# Patient Record
Sex: Male | Born: 1960
Health system: Southern US, Community
[De-identification: ages and names within clinical notes are randomized; demographics above are authoritative.]

## PROBLEM LIST (undated history)

## (undated) DIAGNOSIS — I10 Essential (primary) hypertension: Secondary | ICD-10-CM

## (undated) DIAGNOSIS — G4730 Sleep apnea, unspecified: Secondary | ICD-10-CM

## (undated) HISTORY — PX: OTHER SURGICAL HISTORY: SHX169

## (undated) HISTORY — PX: KNEE SURGERY: SHX244

## (undated) HISTORY — PX: ELBOW SURGERY: SHX618

## (undated) HISTORY — PX: MOHS SURGERY: SHX181

---

## 1999-03-18 DIAGNOSIS — C801 Malignant (primary) neoplasm, unspecified: Secondary | ICD-10-CM

## 1999-03-18 HISTORY — DX: Malignant (primary) neoplasm, unspecified: C80.1

## 2001-05-03 ENCOUNTER — Ambulatory Visit (HOSPITAL_BASED_OUTPATIENT_CLINIC_OR_DEPARTMENT_OTHER): Admission: RE | Admit: 2001-05-03 | Discharge: 2001-05-03 | Payer: Self-pay | Admitting: Urology

## 2001-05-03 ENCOUNTER — Encounter (INDEPENDENT_AMBULATORY_CARE_PROVIDER_SITE_OTHER): Payer: Self-pay | Admitting: Specialist

## 2001-05-18 ENCOUNTER — Encounter: Payer: Self-pay | Admitting: Urology

## 2001-05-18 ENCOUNTER — Encounter: Admission: RE | Admit: 2001-05-18 | Discharge: 2001-05-18 | Payer: Self-pay | Admitting: Urology

## 2005-05-27 ENCOUNTER — Emergency Department (HOSPITAL_COMMUNITY): Admission: EM | Admit: 2005-05-27 | Discharge: 2005-05-27 | Payer: Self-pay | Admitting: Family Medicine

## 2005-11-02 ENCOUNTER — Emergency Department (HOSPITAL_COMMUNITY): Admission: EM | Admit: 2005-11-02 | Discharge: 2005-11-02 | Payer: Self-pay | Admitting: Emergency Medicine

## 2006-02-16 ENCOUNTER — Ambulatory Visit (HOSPITAL_BASED_OUTPATIENT_CLINIC_OR_DEPARTMENT_OTHER): Admission: RE | Admit: 2006-02-16 | Discharge: 2006-02-16 | Payer: Self-pay | Admitting: Orthopedic Surgery

## 2007-11-18 ENCOUNTER — Ambulatory Visit: Payer: Self-pay | Admitting: Vascular Surgery

## 2007-11-18 ENCOUNTER — Encounter (INDEPENDENT_AMBULATORY_CARE_PROVIDER_SITE_OTHER): Payer: Self-pay | Admitting: Family Medicine

## 2007-11-18 ENCOUNTER — Ambulatory Visit: Admission: RE | Admit: 2007-11-18 | Discharge: 2007-11-18 | Payer: Self-pay | Admitting: Family Medicine

## 2008-02-01 IMAGING — CT CT CERVICAL SPINE W/O CM
3 of 4 series · 15 of 33 positions shown, 18 images · non-contrast
Comparison: None.
COMPARISON: None.

CLINICAL DATA: Fell from ladder, striking back of head. Questionable loss of
consciousness. Frontal laceration. Right upper extremity pain and numbness.

CRANIAL CT WITHOUT CONTRAST  11/02/2005:
TECHNIQUE: 5 mm axial images were obtained from the skull base through the brain
to the vertex.
TECHNIQUE: Multidetector CT imaging of the cervical spine was performed without
contrast.  Sagittal and coronal plane reformatted images were reconstructed from
the axial CT data, and were also reviewed.

[Series 7: c_spine 2.0 b31s detail · axial · 0.25mm/px · z∈[-352,-202]mm · 7 of 97 slices shown, 9 images]
[im 11/97  soft-tissue]
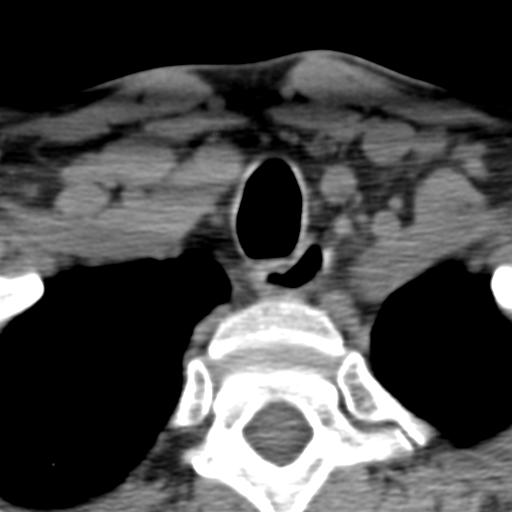
[im 11/97  bone]
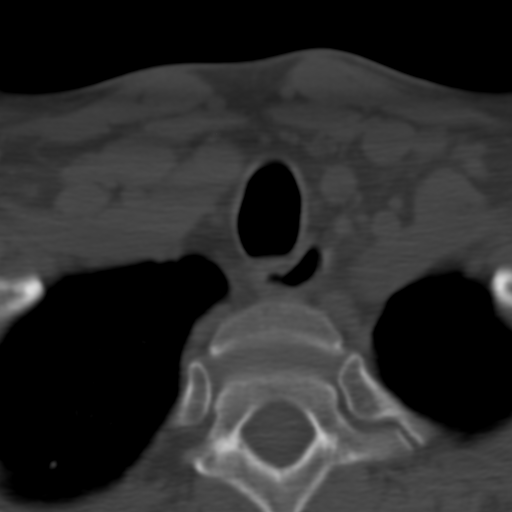
[im 22/97  bone]
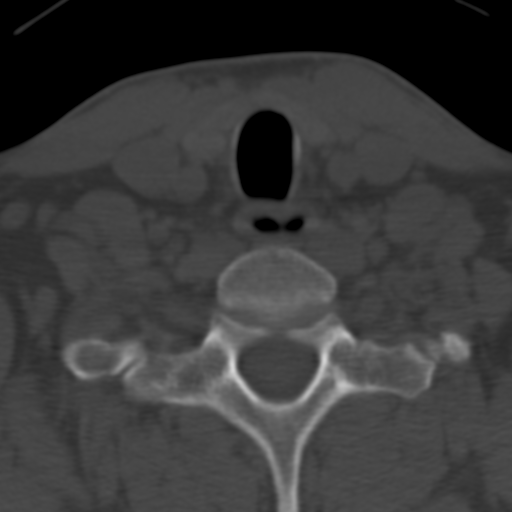
[im 33/97  bone]
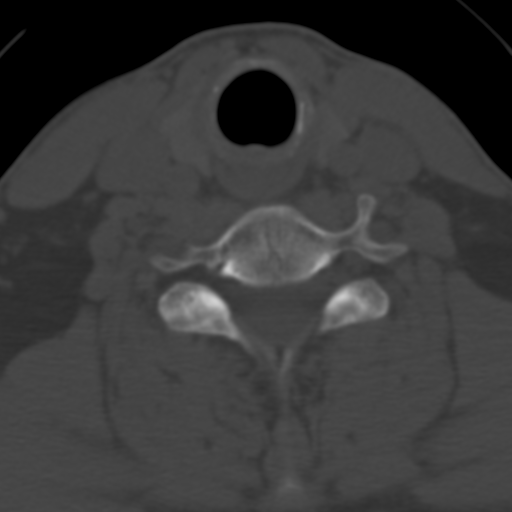
[im 54/97  bone]
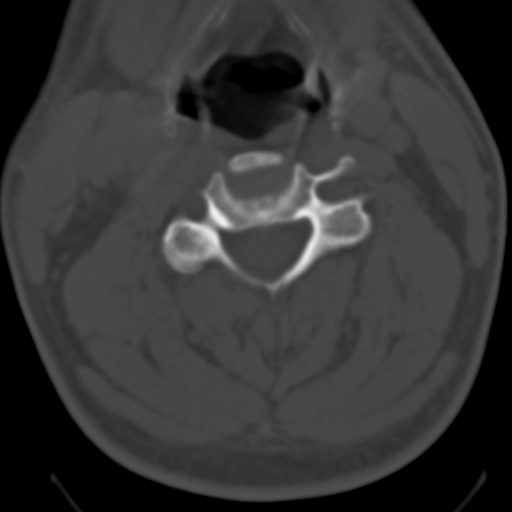
[im 65/97  soft-tissue]
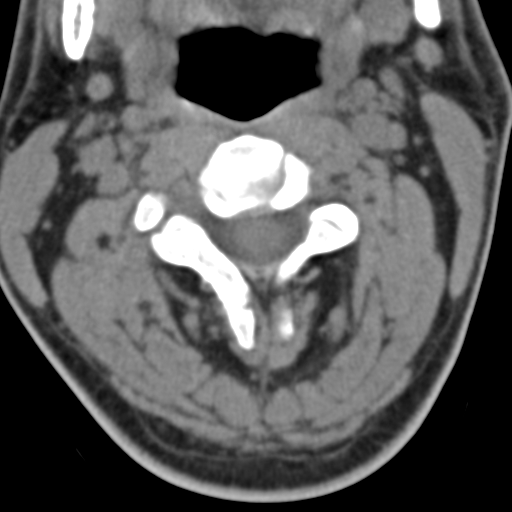
[im 65/97  bone]
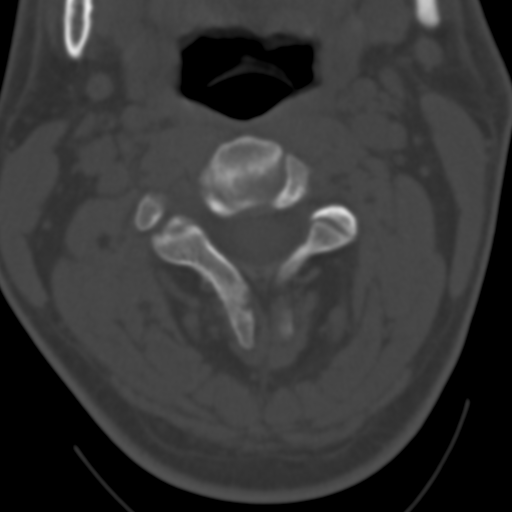
[im 75/97  bone]
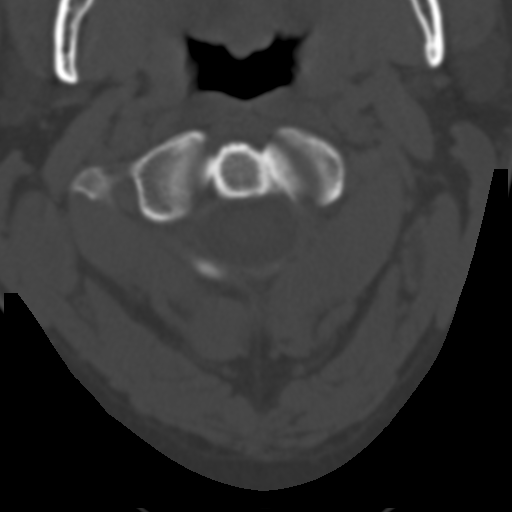
[im 86/97  bone]
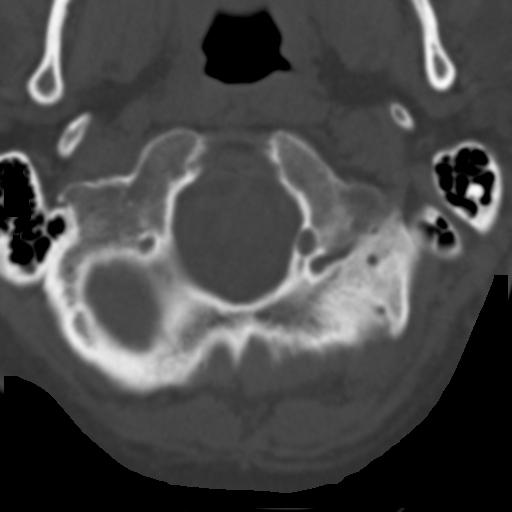

[Series 8: c_spine 2.0 spo sag · sagittal · 0.41mm/px · 5 of 47 slices shown, 6 images]
[im 16/47  bone]
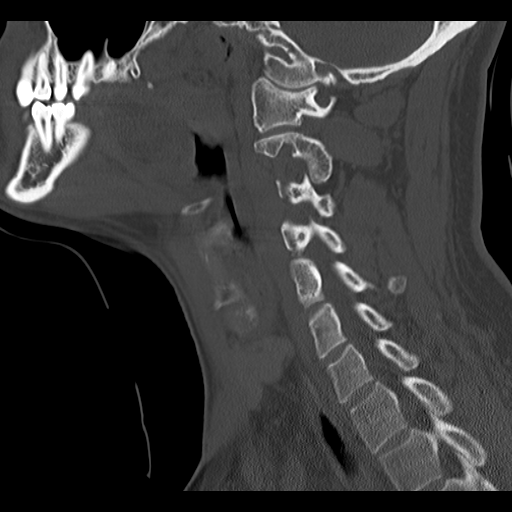
[im 20/47  bone]
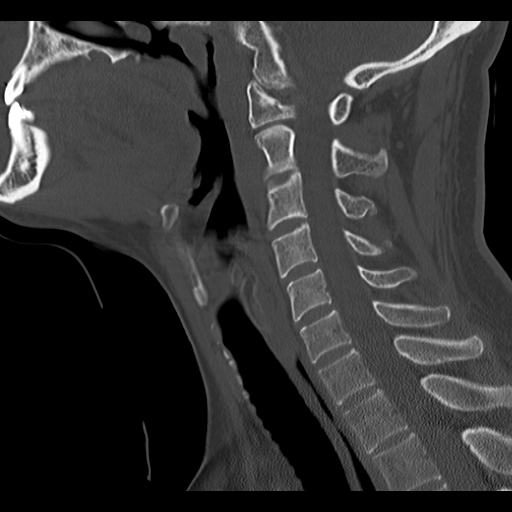
[im 24/47  soft-tissue]
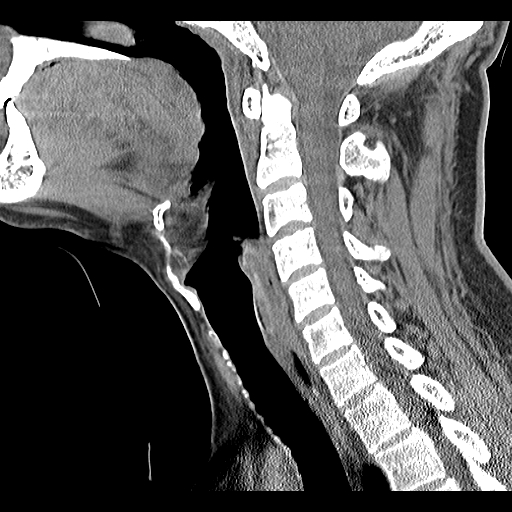
[im 24/47  bone]
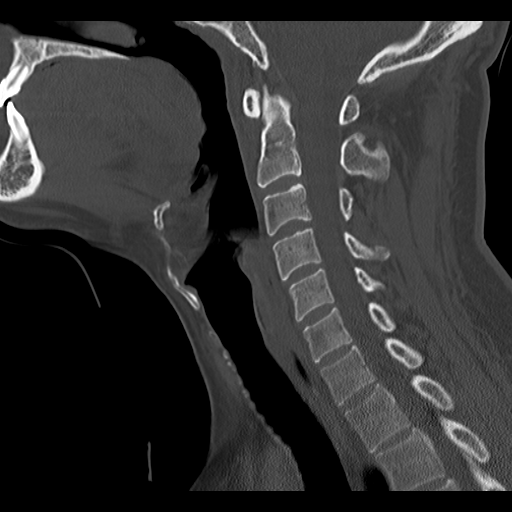
[im 27/47  bone]
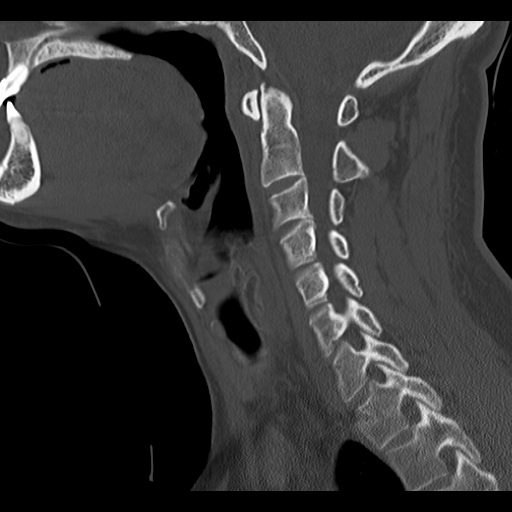
[im 31/47  bone]
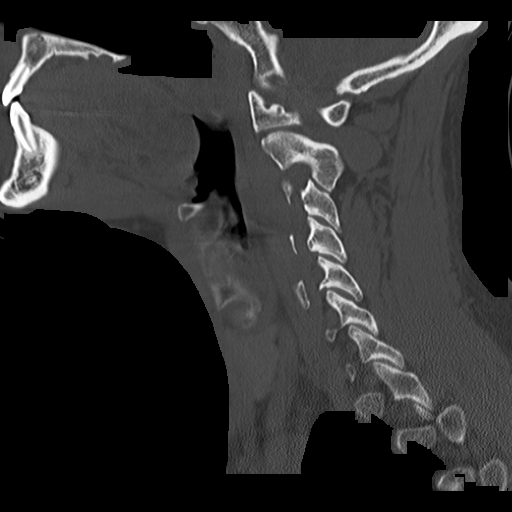

[Series 9: c_spine 2.0 spo · coronal · 0.48mm/px · 3 of 51 slices shown]
[im 11/51  bone]
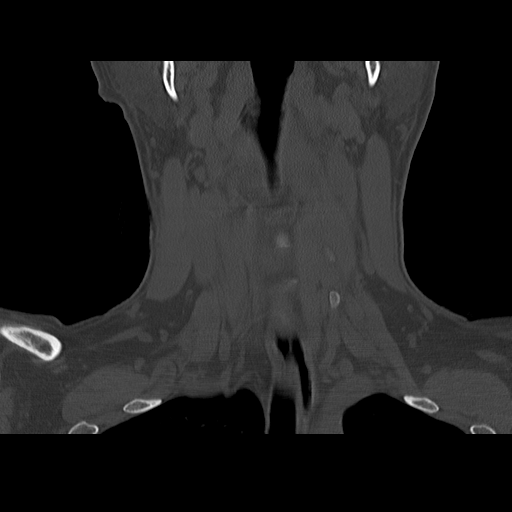
[im 21/51  bone]
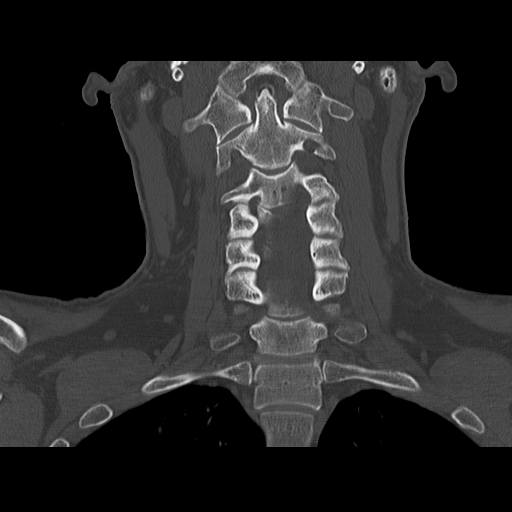
[im 31/51  bone]
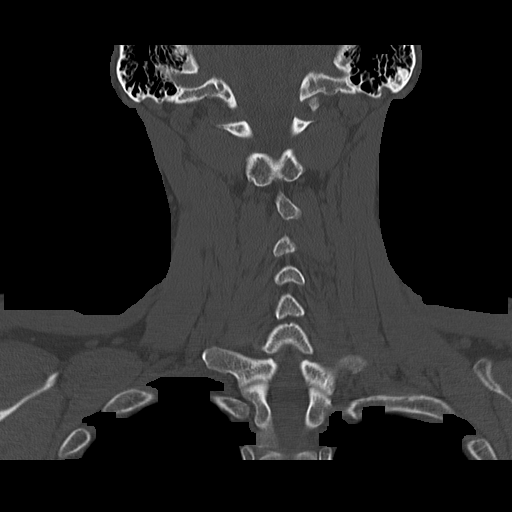

[15 of 33 positions shown; findings below may reference images not displayed]

FINDINGS: The ventricular system is normal in size and appearance for age. There
is no mass lesion or midline shift. There is no hemorrhage or hematoma. No
extra-axial fluid collections are identified. There is no evidence of acute
stroke or other focal brain parenchymal abnormalities. 

The bone window images demonstrate no osseous abnormalities involving the skull.
The visualized paranasal sinuses and the mastoid air cells appear well aerated.
The frontal sinuses are hypoplastic.
IMPRESSION: Normal unenhanced cranial CT. 

CT CERVICAL SPINE WITHOUT CONTRAST 11/02/2005:
FINDINGS: No fractures are identified involving the cervical spine. The
sagittal reconstructed images show anatomic posterior alignment. The disc spaces
are well-preserved throughout. There is a benign appearing lucency in the C4
vertebral body anteriorly, possibly a small hemangioma. The facet joints are
intact throughout. The coronal reformatted images showing normal C1-C2
articulation. The dens is intact. The lateral masses are intact throughout.
There is moderate right foraminal stenosis at C3 or due to a combination of
facet and uncinate hypertrophy. Mild right foraminal stenosis at C4-C5 and
moderate right foraminal stenosis at C5-C6 is present for the same reasons.
There is no evidence of spinal stenosis. The soft tissue window images
demonstrate mild disc bulges C3-C4 and C4-C5, though frank disc extrusions were
not identified.
IMPRESSION: 1. No cervical spine fractures are identified.
2. Right foraminal stenoses at C3-C4, C4-C5 and C5-C6 due to a combination of
facet and uncinate hypertrophy.

## 2010-08-02 NOTE — Op Note (Signed)
[facility]  Patient:    Malik Miller, Malik Miller Visit Number: [id number] MRN: [id number]          Service Type: NES Location: [facility] Attending Physician:  Liborio Nixon Dictated by:   Bertram Millard. Dahlstedt, M.D. Proc. Date: 05/03/01 Admit Date:  05/03/2001   CC:         Molly Maduro A. Eliezer Lofts., M.D.   Operative Report  PREOPERATIVE DIAGNOSIS:  Bladder tumor.  POSTOPERATIVE DIAGNOSIS:  Bladder tumor.  PRINCIPAL PROCEDURE:  TURBT.  SURGEON:  Bertram Millard. Dahlstedt, M.D.  ANESTHESIA:  General with LMA.  COMPLICATIONS:  None.  BRIEF HISTORY:  A 50 year old male, who presented to my attention recently with just a few episodes of gross, painless hematuria.  He was found to have normal upper tracts, and his bladder showed a small, 1 cm bladder tumor.  No other lesions were seen within his bladder.  The patient was counseled regarding this and his treatment.  Risks and complications of TURBT were discussed.  There are no alternatives.  He understands the risks and complications and desires to proceed.  DESCRIPTION OF PROCEDURE:  The patient was administered a general anesthetic using the LMA.  He was placed in the dorsal lithotomy position.  Genitalia and perineum were prepped and draped.  A 21 French panendoscope was passed through his urethra which was normal.  The prostate was not obstructed.  His bladder was entered and inspected circumferentially.  There was one small, 1 cm bladder tumor in his right bladder wall.  This was grasped with cold cup biopsy forceps and removed.  The tumor was sent separately.  Two other bites were taken from underneath the tumor and were labeled base of tumor.  These were sent separately.  The Bugbee electrode was used to cauterize the biopsy base of the tumor.  No residual tumor was seen.  The bladder was then again inspected, and no further lesions were seen.  At this point, the bladder was drained and the  scope removed.  The procedure was terminated.  A B&O suppository was placed within the rectum.  The patient tolerated the procedure well,  He was awakened, extubated, and taken to the PACU in stable condition.  He will follow up with me in two weeks.  DISCHARGE MEDICATIONS: 1. Vicodin 1 p.o. q.4h. p.r.n. pain. 2. Bactrim DS 1 p.o. b.i.d. x 3 days. 3. Urised 1 p.o. q.6h. p.r.n. bladder discomfort #15. Dictated by:   Bertram Millard. Dahlstedt, M.D. Attending Physician:  Liborio Nixon DD:  05/03/01 TD:  05/03/01 Job: [id number]

## 2010-08-02 NOTE — Op Note (Signed)
NAME:  Malik Miller, Malik Miller NO.:  [id number]   MEDICAL RECORD NO.:  [id number]          PATIENT TYPE:  AMB   LOCATION:  DSC                          FACILITY:  [facility]   PHYSICIAN:  Renner A. Thurston Hole, M.D. 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   PROCEDURE:  1. Left knee UA followed by arthroscopic partial medial and lateral      meniscotomies.  2. Left knee partial discoid lateral meniscus.  3. Left knee chondroplasty.   SURGEON:  Dr. Salvatore Marvel.   ASSISTANT:  Hardin Negus, P.A.   ANESTHESIA:  General.   OPERATIVE TIME:  30 minutes.   COMPLICATIONS:  None.   INDICATIONS FOR PROCEDURE:  [name] is a 50 year old gentleman who  has had significant left knee pain for the past two to three months with  exam and MRI documenting a partial medial meniscus tear with a discoid  lateral meniscus and mild chondromalacia.  He has failed conservative  care and is now to undergo arthroscopy.   DESCRIPTION OF PROCEDURE:  [name] is brought to the operating room  on February 16, 2006 and placed on the operating room table in the supine  position.  After being placed under general anesthesia, his left knee  was examined.  He had full range of motion.  His knee was a stable  ligamentous exam with normal patellar tracking.  The knee was sterilely  injected with 0.25% Marcaine with epinephrine.  Left leg was then  prepped using sterile DuraPrep and draped using sterile technique.  Originally through an anterolateral portal, the arthroscope with a pump  attached was placed and through an anteromedial portal, an arthroscopic  probe was placed.  On initial inspection of the medial compartment, he  had grade 1 and 2 chondromalacia, medial meniscus tear, posterior medial  horn, of which 30 to 40% was resected back to a stable rim.  Intercolumnar notch was inspected.  Anterior, posterior cruciate  ligaments were normal.  Lateral compartment inspected.  Articular  cartilage was normal.  Lateral meniscus was a partial discoid lateral  meniscus with tearing of the inner 20% of the posterior and lateral  horn, which was resected back to a stable rim.  Patellofemoral joint  showed mild grade 1 to 2 chondromalacia.  The patella tracked normally.  There was a medial plica band not impinging on motion and thus was not  resected.  Moderate and lateral synovitis was debrided, otherwise the  medial and lateral gutters are free of pathology.  After this was done,  it was felt that all pathology had been satisfactorily addressed.  The  instruments were removed.  Portals closed with 3-0 nylon suture and  injected with 0.25% Marcaine with epinephrine and 4 mg of morphine.  Sterile dressings applied and the patient awakened and taken to the  recovery room in stable  condition.   FOLLOW UP:  [name] will be followed as an outpatient on Vicodin  and Naprosyn.  We will see him back in the office in a week for sutures  out and follow-up.      Shunsuke A. Thurston Hole, M.D.  Electronically Signed     RAW/MEDQ  D:  02/16/2006  T:  02/17/2006  Job:  [id number]

## 2012-11-16 ENCOUNTER — Other Ambulatory Visit: Payer: Self-pay | Admitting: Dermatology

## 2014-05-31 ENCOUNTER — Ambulatory Visit: Payer: Self-pay | Admitting: Podiatry

## 2014-06-01 ENCOUNTER — Ambulatory Visit (INDEPENDENT_AMBULATORY_CARE_PROVIDER_SITE_OTHER): Payer: BLUE CROSS/BLUE SHIELD | Admitting: Podiatry

## 2014-06-01 ENCOUNTER — Ambulatory Visit (INDEPENDENT_AMBULATORY_CARE_PROVIDER_SITE_OTHER): Payer: BLUE CROSS/BLUE SHIELD

## 2014-06-01 DIAGNOSIS — M79671 Pain in right foot: Secondary | ICD-10-CM

## 2014-06-01 DIAGNOSIS — M7661 Achilles tendinitis, right leg: Secondary | ICD-10-CM

## 2014-06-01 NOTE — Patient Instructions (Signed)

## 2014-06-01 NOTE — Progress Notes (Signed)
Subjective:     Patient ID: Malik Miller, male   DOB: 1960/04/10, 54 y.o.   MRN: [id number]  HPI patient presents stating I hurt my right Achilles tendon in November when I ran 3 miles after not running for a long time. It's been bothering me since and it feels thick   Review of Systems  All other systems reviewed and are negative.      Objective:   Physical Exam  Constitutional: He is oriented to person, place, and time.  Cardiovascular: Intact distal pulses.   Musculoskeletal: Normal range of motion.  Neurological: He is oriented to person, place, and time.  Skin: Skin is warm.  Nursing note and vitals reviewed.  neurovascular status found to be intact with muscle strength adequate range of motion within normal limits. Patient's noted to have moderate equinus condition right and upon palpation the musculotendinous junction of the right Achilles tendon is nodular and sore when pressed. I checked function of the tendon and found to be adequate and it appears we are dealing with a pre-tear of this area or may be a partial tear of the Achilles tendon     Assessment:     Explained Achilles tendon and pathology to patient and reviewed partial tear versus inflammatory or nodular process    Plan:     H&P and x-ray reviewed. Today I advised on aggressive physical therapy consisting of heat stretch and ice and I placed in a high level immobilizer to prevent movement of the Achilles tendon. Patient will be seen back in 4 weeks and may require MRI if symptoms persist

## 2014-06-01 NOTE — Progress Notes (Signed)
   Subjective:    Patient ID: Malik Miller, male    DOB: [birth year], 55 y.o.   MRN: [id number]  HPI  Pt presents with right foot pain, worse in the mornings when getting up, pain is in the achilles area and radiates toward heel. Has tried NSAIDS  Review of Systems  All other systems reviewed and are negative.      Objective:   Physical Exam        Assessment & Plan:

## 2014-06-07 ENCOUNTER — Ambulatory Visit: Payer: Self-pay | Admitting: Podiatry

## 2014-06-29 ENCOUNTER — Encounter: Payer: Self-pay | Admitting: Podiatry

## 2014-06-29 ENCOUNTER — Ambulatory Visit (INDEPENDENT_AMBULATORY_CARE_PROVIDER_SITE_OTHER): Payer: BLUE CROSS/BLUE SHIELD | Admitting: Podiatry

## 2014-06-29 VITALS — BP 137/87 | HR 60 | Resp 12

## 2014-06-29 DIAGNOSIS — M7661 Achilles tendinitis, right leg: Secondary | ICD-10-CM | POA: Diagnosis not present

## 2014-06-29 NOTE — Progress Notes (Signed)
Subjective:     Patient ID: Malik Miller, male   DOB: 08-09-1960, 54 y.o.   MRN: [id number]  HPI patient states the back of my heel is doing a lot better with minimal discomfort and discussed the relationship of his tight heel cord to the problem   Review of Systems     Objective:   Physical Exam Neurovascular status intact muscle strength adequate range of motion within normal limits with diminished discomfort posterior aspect right heel at the insertion of the Achilles tendon with diminished fluid redness and edema    Assessment:     Improved Achilles tendinitis    Plan:     Advised on physical therapy anti-inflammatories and heel lifts. We will stop the boot gradually over the next 3-4 weeks and increase exercise and I did discuss that were to reoccur we'll need to get an MRI

## 2016-10-17 DIAGNOSIS — H43393 Other vitreous opacities, bilateral: Secondary | ICD-10-CM | POA: Diagnosis not present

## 2016-10-30 DIAGNOSIS — E782 Mixed hyperlipidemia: Secondary | ICD-10-CM | POA: Diagnosis not present

## 2016-10-30 DIAGNOSIS — E291 Testicular hypofunction: Secondary | ICD-10-CM | POA: Diagnosis not present

## 2016-10-30 DIAGNOSIS — N529 Male erectile dysfunction, unspecified: Secondary | ICD-10-CM | POA: Diagnosis not present

## 2016-10-30 DIAGNOSIS — I10 Essential (primary) hypertension: Secondary | ICD-10-CM | POA: Diagnosis not present

## 2016-11-10 DIAGNOSIS — Z23 Encounter for immunization: Secondary | ICD-10-CM | POA: Diagnosis not present

## 2017-05-04 DIAGNOSIS — I10 Essential (primary) hypertension: Secondary | ICD-10-CM | POA: Diagnosis not present

## 2017-05-04 DIAGNOSIS — E782 Mixed hyperlipidemia: Secondary | ICD-10-CM | POA: Diagnosis not present

## 2017-05-04 DIAGNOSIS — M7661 Achilles tendinitis, right leg: Secondary | ICD-10-CM | POA: Diagnosis not present

## 2017-05-04 DIAGNOSIS — N529 Male erectile dysfunction, unspecified: Secondary | ICD-10-CM | POA: Diagnosis not present

## 2017-05-31 DIAGNOSIS — M545 Low back pain: Secondary | ICD-10-CM | POA: Diagnosis not present

## 2017-05-31 DIAGNOSIS — S39012A Strain of muscle, fascia and tendon of lower back, initial encounter: Secondary | ICD-10-CM | POA: Diagnosis not present

## 2017-06-11 DIAGNOSIS — R740 Nonspecific elevation of levels of transaminase and lactic acid dehydrogenase [LDH]: Secondary | ICD-10-CM | POA: Diagnosis not present

## 2017-07-29 DIAGNOSIS — Z85828 Personal history of other malignant neoplasm of skin: Secondary | ICD-10-CM | POA: Diagnosis not present

## 2017-07-29 DIAGNOSIS — L814 Other melanin hyperpigmentation: Secondary | ICD-10-CM | POA: Diagnosis not present

## 2017-07-29 DIAGNOSIS — D1801 Hemangioma of skin and subcutaneous tissue: Secondary | ICD-10-CM | POA: Diagnosis not present

## 2017-07-29 DIAGNOSIS — L821 Other seborrheic keratosis: Secondary | ICD-10-CM | POA: Diagnosis not present

## 2017-12-29 DIAGNOSIS — H01001 Unspecified blepharitis right upper eyelid: Secondary | ICD-10-CM | POA: Diagnosis not present

## 2017-12-29 DIAGNOSIS — H01004 Unspecified blepharitis left upper eyelid: Secondary | ICD-10-CM | POA: Diagnosis not present

## 2017-12-29 DIAGNOSIS — C44111 Basal cell carcinoma of skin of unspecified eyelid, including canthus: Secondary | ICD-10-CM | POA: Diagnosis not present

## 2018-01-07 DIAGNOSIS — D485 Neoplasm of uncertain behavior of skin: Secondary | ICD-10-CM | POA: Diagnosis not present

## 2018-02-01 DIAGNOSIS — L820 Inflamed seborrheic keratosis: Secondary | ICD-10-CM | POA: Diagnosis not present

## 2018-02-01 DIAGNOSIS — D485 Neoplasm of uncertain behavior of skin: Secondary | ICD-10-CM | POA: Diagnosis not present

## 2018-02-09 DIAGNOSIS — E782 Mixed hyperlipidemia: Secondary | ICD-10-CM | POA: Diagnosis not present

## 2018-02-09 DIAGNOSIS — N529 Male erectile dysfunction, unspecified: Secondary | ICD-10-CM | POA: Diagnosis not present

## 2018-02-09 DIAGNOSIS — R1032 Left lower quadrant pain: Secondary | ICD-10-CM | POA: Diagnosis not present

## 2018-02-09 DIAGNOSIS — I10 Essential (primary) hypertension: Secondary | ICD-10-CM | POA: Diagnosis not present

## 2018-05-17 DIAGNOSIS — E782 Mixed hyperlipidemia: Secondary | ICD-10-CM | POA: Diagnosis not present

## 2018-05-17 DIAGNOSIS — R6882 Decreased libido: Secondary | ICD-10-CM | POA: Diagnosis not present

## 2018-05-17 DIAGNOSIS — I10 Essential (primary) hypertension: Secondary | ICD-10-CM | POA: Diagnosis not present

## 2018-05-17 DIAGNOSIS — Z125 Encounter for screening for malignant neoplasm of prostate: Secondary | ICD-10-CM | POA: Diagnosis not present

## 2018-05-17 DIAGNOSIS — Z0000 Encounter for general adult medical examination without abnormal findings: Secondary | ICD-10-CM | POA: Diagnosis not present

## 2018-05-17 DIAGNOSIS — Z1159 Encounter for screening for other viral diseases: Secondary | ICD-10-CM | POA: Diagnosis not present

## 2018-05-17 DIAGNOSIS — N529 Male erectile dysfunction, unspecified: Secondary | ICD-10-CM | POA: Diagnosis not present

## 2018-05-21 DIAGNOSIS — Z8551 Personal history of malignant neoplasm of bladder: Secondary | ICD-10-CM | POA: Diagnosis not present

## 2018-05-21 DIAGNOSIS — N5201 Erectile dysfunction due to arterial insufficiency: Secondary | ICD-10-CM | POA: Diagnosis not present

## 2018-07-07 ENCOUNTER — Other Ambulatory Visit: Payer: Self-pay | Admitting: Surgery

## 2018-07-07 DIAGNOSIS — K4020 Bilateral inguinal hernia, without obstruction or gangrene, not specified as recurrent: Secondary | ICD-10-CM

## 2018-07-12 ENCOUNTER — Other Ambulatory Visit: Payer: Self-pay

## 2018-07-12 ENCOUNTER — Ambulatory Visit
Admission: RE | Admit: 2018-07-12 | Discharge: 2018-07-12 | Disposition: A | Discharge status: Home / Self Care | Payer: BLUE CROSS/BLUE SHIELD | Source: Ambulatory Visit | Attending: Surgery | Admitting: Surgery

## 2018-07-12 DIAGNOSIS — K4020 Bilateral inguinal hernia, without obstruction or gangrene, not specified as recurrent: Secondary | ICD-10-CM | POA: Diagnosis not present

## 2018-07-12 MED ORDER — IOPAMIDOL (ISOVUE-300) INJECTION 61%
100.0000 mL | Freq: Once | INTRAVENOUS | Status: AC | PRN
  Administered 2018-07-12: 100 mL via INTRAVENOUS

## 2018-07-22 ENCOUNTER — Ambulatory Visit: Payer: Self-pay | Admitting: General Surgery

## 2018-07-22 DIAGNOSIS — K4020 Bilateral inguinal hernia, without obstruction or gangrene, not specified as recurrent: Secondary | ICD-10-CM | POA: Diagnosis not present

## 2018-07-22 NOTE — H&P (Signed)
  History of Present Illness Ralene Ok MD; 07/22/2018 11:10 AM) The patient is a 58 year old male who presents with an inguinal hernia. Chief Complaint: Bilateral hernias  Patient is a 58 year old male with a history of hypercholesterolemia, hypertension comes in with bilateral inguinal hernias. Patient states this began approximately symptoms 6 months ago. He states he noticed a pop to the left inguinal area after lifting something heavy. He states that he drove thereafter and noticed some stiffness. He had minimal movement thereafter. Patient states that since that time he started some discomfort and bilateral inguinal areas.  Patient recently underwent a CT scan which revealed bilateral fat-containing inguinal hernias. I did review this personally.  Patient's had previous transurethral bladder cancer removal, no other abdominal surgeries.    Allergies Emeline Gins, [place]; 07/22/2018 10:47 AM) No Known Allergies [07/07/2018]: No Known Drug Allergies [07/07/2018]: Allergies Reconciled   Medication History Emeline Gins, CMA; 07/22/2018 10:48 AM) amLODIPine Besylate (5MG  Tablet, Oral) Active. Atorvastatin Calcium (10MG  Tablet, Oral) Active. Lisinopril-hydroCHLOROthiazide (20-12.5MG  Tablet, Oral) Active. Aspirin (81MG  Tablet, Oral) Active. Cialis (20MG  Tablet, Oral) Active. Medications Reconciled    Review of Systems Ralene Ok, MD; 07/22/2018 11:11 AM) All other systems negative  Vitals Emeline Gins CMA; 07/22/2018 10:47 AM) 07/22/2018 10:46 AM Weight: 243.6 lb Height: 71in Body Surface Area: 2.29 m Body Mass Index: 33.97 kg/m  Temp.: 98.28F  Pulse: 64 (Regular)  BP: 138/82 (Sitting, Left Arm, Standard)       Physical Exam Ralene Ok MD; 07/22/2018 11:10 AM) The physical exam findings are as follows: Note:Constitutional: No acute distress, conversant, appears stated age  Eyes: Anicteric sclerae, moist conjunctiva, no lid  lag  Neck: No thyromegaly, trachea midline, no cervical lymphadenopathy  Lungs: Clear to auscultation biilaterally, normal respiratory effot  Cardiovascular: regular rate & rhythm, no murmurs, no peripheal edema, pedal pulses 2+  GI: Soft, no masses or hepatosplenomegaly, non-tender to palpation  MSK: Normal gait, no clubbing cyanosis, edema  Skin: No rashes, palpation reveals normal skin turgor  Psychiatric: Appropriate judgment and insight, oriented to person, place, and time  Abdomen Inspection Hernias - Inguinal hernia - Bilateral - Reducible.    Assessment & Plan Ralene Ok MD; 07/22/2018 11:10 AM) BILATERAL INGUINAL HERNIA WITHOUT OBSTRUCTION OR GANGRENE (K40.20) Impression: 58 year old male with bilateral inguinal hernias. I discussed the surgery in detail with him and his wife over the phone.  1. The patient will like to proceed to the operating room for laparoscopic bilateral inguinal hernia repair with mesh.  2. I discussed with the patient the signs and symptoms of incarceration and strangulation and the need to proceed to the ER should they occur.  3. I discussed with the patient the risks and benefits of the procedure to include but not limited to: Infection, bleeding, damage to surrounding structures, possible need for further surgery, possible nerve pain, and possible recurrence. The patient was understanding and wishes to proceed.

## 2018-09-06 ENCOUNTER — Other Ambulatory Visit (HOSPITAL_COMMUNITY): Payer: BLUE CROSS/BLUE SHIELD

## 2018-09-07 ENCOUNTER — Other Ambulatory Visit (HOSPITAL_COMMUNITY)
Admission: RE | Admit: 2018-09-07 | Discharge: 2018-09-07 | Disposition: A | Discharge status: Home / Self Care | Payer: BC Managed Care – PPO | Source: Ambulatory Visit | Attending: General Surgery | Admitting: General Surgery

## 2018-09-07 DIAGNOSIS — Z1159 Encounter for screening for other viral diseases: Secondary | ICD-10-CM | POA: Insufficient documentation

## 2018-09-07 LAB — SARS CORONAVIRUS 2 (TAT 6-24 HRS): SARS Coronavirus 2: NEGATIVE

## 2018-09-09 ENCOUNTER — Other Ambulatory Visit: Payer: Self-pay

## 2018-09-09 ENCOUNTER — Encounter (HOSPITAL_COMMUNITY): Payer: Self-pay | Admitting: *Deleted

## 2018-09-09 ENCOUNTER — Other Ambulatory Visit (HOSPITAL_COMMUNITY)
Admission: RE | Admit: 2018-09-09 | Discharge: 2018-09-09 | Disposition: A | Discharge status: Home / Self Care | Payer: BC Managed Care – PPO | Source: Ambulatory Visit | Attending: General Surgery | Admitting: General Surgery

## 2018-09-09 DIAGNOSIS — Z1159 Encounter for screening for other viral diseases: Secondary | ICD-10-CM | POA: Insufficient documentation

## 2018-09-09 LAB — SARS CORONAVIRUS 2 (TAT 6-24 HRS): SARS Coronavirus 2: NEGATIVE

## 2018-09-09 NOTE — Progress Notes (Signed)
Pre-op instructions given to the patient and his wife.  Patient and wife concerned because they visited [facility] over the weekend.  They stayed in the RV and only ate out once during the trip.  Where they stated that that practiced social distancing and wearing masks appropriately   Since they returned the patients wife Almyra Free has been keeping an eye their temperatures.   Mr. Proto yesterday had a one time temp of 99.4 after he went outside and watered the flowers.  He did have a 98.0 temp after the 99.4 temp yesterday.    Spoke with Dr. Kalman Shan with anesthesiology who wants the patient to be retested prior to his procedure tomorrow. Patient has been notified of the need for being retested. Patient instructed to arrive at the [facility] by 330 this afternoon.    Dr. Emogene Morgan office contacted about the situation

## 2018-09-10 ENCOUNTER — Other Ambulatory Visit: Payer: Self-pay

## 2018-09-10 ENCOUNTER — Encounter (HOSPITAL_COMMUNITY): Payer: Self-pay

## 2018-09-10 ENCOUNTER — Ambulatory Visit (HOSPITAL_COMMUNITY): Payer: BC Managed Care – PPO | Admitting: Vascular Surgery

## 2018-09-10 ENCOUNTER — Encounter (HOSPITAL_COMMUNITY)
Admission: RE | Disposition: A | Discharge status: Home / Self Care | Payer: Self-pay | Source: Home / Self Care | Attending: General Surgery

## 2018-09-10 ENCOUNTER — Ambulatory Visit (HOSPITAL_COMMUNITY)
Admission: RE | Admit: 2018-09-10 | Discharge: 2018-09-10 | Disposition: A | Discharge status: Home / Self Care | Payer: BC Managed Care – PPO | Attending: General Surgery | Admitting: General Surgery

## 2018-09-10 DIAGNOSIS — Z7982 Long term (current) use of aspirin: Secondary | ICD-10-CM | POA: Insufficient documentation

## 2018-09-10 DIAGNOSIS — Z79899 Other long term (current) drug therapy: Secondary | ICD-10-CM | POA: Insufficient documentation

## 2018-09-10 DIAGNOSIS — E7800 Pure hypercholesterolemia, unspecified: Secondary | ICD-10-CM | POA: Insufficient documentation

## 2018-09-10 DIAGNOSIS — K4020 Bilateral inguinal hernia, without obstruction or gangrene, not specified as recurrent: Secondary | ICD-10-CM | POA: Diagnosis not present

## 2018-09-10 DIAGNOSIS — Z8551 Personal history of malignant neoplasm of bladder: Secondary | ICD-10-CM | POA: Diagnosis not present

## 2018-09-10 DIAGNOSIS — D176 Benign lipomatous neoplasm of spermatic cord: Secondary | ICD-10-CM | POA: Diagnosis not present

## 2018-09-10 DIAGNOSIS — G4730 Sleep apnea, unspecified: Secondary | ICD-10-CM | POA: Diagnosis not present

## 2018-09-10 DIAGNOSIS — I10 Essential (primary) hypertension: Secondary | ICD-10-CM | POA: Insufficient documentation

## 2018-09-10 HISTORY — DX: Sleep apnea, unspecified: G47.30

## 2018-09-10 HISTORY — DX: Essential (primary) hypertension: I10

## 2018-09-10 HISTORY — PX: INGUINAL HERNIA REPAIR: SHX194

## 2018-09-10 HISTORY — PX: INSERTION OF MESH: SHX5868

## 2018-09-10 LAB — CBC
HCT: 43.7 % (ref 39.0–52.0)
Hemoglobin: 14.9 g/dL (ref 13.0–17.0)
MCH: 30.1 pg (ref 26.0–34.0)
MCHC: 34.1 g/dL (ref 30.0–36.0)
MCV: 88.3 fL (ref 80.0–100.0)
Platelets: 180 10*3/uL (ref 150–400)
RBC: 4.95 MIL/uL (ref 4.22–5.81)
RDW: 12.2 % (ref 11.5–15.5)
WBC: 4.7 10*3/uL (ref 4.0–10.5)
nRBC: 0 % (ref 0.0–0.2)

## 2018-09-10 LAB — BASIC METABOLIC PANEL
Anion gap: 9 (ref 5–15)
BUN: 12 mg/dL (ref 6–20)
CO2: 25 mmol/L (ref 22–32)
Calcium: 9.3 mg/dL (ref 8.9–10.3)
Chloride: 107 mmol/L (ref 98–111)
Creatinine, Ser: 0.82 mg/dL (ref 0.61–1.24)
GFR calc Af Amer: >60 mL/min (ref >60)
GFR calc non Af Amer: >60 mL/min (ref >60)
Glucose, Bld: 94 mg/dL (ref 70–99)
Potassium: 3.7 mmol/L (ref 3.5–5.1)
Sodium: 141 mmol/L (ref 135–145)

## 2018-09-10 SURGERY — REPAIR, HERNIA, INGUINAL, BILATERAL, LAPAROSCOPIC
Anesthesia: General | Site: Inguinal | Laterality: Bilateral

## 2018-09-10 MED ORDER — CELECOXIB 200 MG PO CAPS
ORAL_CAPSULE | ORAL | Status: AC
  Administered 2018-09-10: 09:00:00 200 mg via ORAL
  Filled 2018-09-10: qty 1

## 2018-09-10 MED ORDER — CELECOXIB 200 MG PO CAPS
200.0000 mg | ORAL_CAPSULE | ORAL | Status: AC
  Administered 2018-09-10: 09:00:00 200 mg via ORAL

## 2018-09-10 MED ORDER — TRAMADOL HCL 50 MG PO TABS
ORAL_TABLET | ORAL | Status: AC
  Administered 2018-09-10: 12:00:00 50 mg
  Filled 2018-09-10: qty 1

## 2018-09-10 MED ORDER — FENTANYL CITRATE (PF) 100 MCG/2ML IJ SOLN
25.0000 ug | INTRAMUSCULAR | Status: DC | PRN
  Administered 2018-09-10 (×2): 50 ug via INTRAVENOUS

## 2018-09-10 MED ORDER — GABAPENTIN 300 MG PO CAPS
ORAL_CAPSULE | ORAL | Status: AC
  Administered 2018-09-10: 300 mg via ORAL
  Filled 2018-09-10: qty 1

## 2018-09-10 MED ORDER — MIDAZOLAM HCL 2 MG/2ML IJ SOLN
INTRAMUSCULAR | Status: AC
  Filled 2018-09-10: qty 2

## 2018-09-10 MED ORDER — ROCURONIUM BROMIDE 50 MG/5ML IV SOSY
PREFILLED_SYRINGE | INTRAVENOUS | Status: DC | PRN
  Administered 2018-09-10: 60 mg via INTRAVENOUS
  Administered 2018-09-10: 10 mg via INTRAVENOUS

## 2018-09-10 MED ORDER — CEFAZOLIN SODIUM-DEXTROSE 2-4 GM/100ML-% IV SOLN
INTRAVENOUS | Status: AC
  Filled 2018-09-10: qty 100

## 2018-09-10 MED ORDER — MIDAZOLAM HCL 5 MG/5ML IJ SOLN
INTRAMUSCULAR | Status: DC | PRN
  Administered 2018-09-10: 2 mg via INTRAVENOUS

## 2018-09-10 MED ORDER — ONDANSETRON HCL 4 MG/2ML IJ SOLN
INTRAMUSCULAR | Status: DC | PRN
  Administered 2018-09-10: 4 mg via INTRAVENOUS

## 2018-09-10 MED ORDER — OXYCODONE HCL 5 MG/5ML PO SOLN: 5.0000 mg | Freq: Once | ORAL | Status: AC | PRN

## 2018-09-10 MED ORDER — DEXAMETHASONE SODIUM PHOSPHATE 10 MG/ML IJ SOLN
INTRAMUSCULAR | Status: DC | PRN
  Administered 2018-09-10: 5 mg via INTRAVENOUS

## 2018-09-10 MED ORDER — PROMETHAZINE HCL 25 MG/ML IJ SOLN: 6.2500 mg | INTRAMUSCULAR | Status: DC | PRN

## 2018-09-10 MED ORDER — CEFAZOLIN SODIUM-DEXTROSE 2-4 GM/100ML-% IV SOLN
2.0000 g | INTRAVENOUS | Status: AC
  Administered 2018-09-10: 2 g via INTRAVENOUS

## 2018-09-10 MED ORDER — FENTANYL CITRATE (PF) 100 MCG/2ML IJ SOLN
INTRAMUSCULAR | Status: AC
  Filled 2018-09-10: qty 2

## 2018-09-10 MED ORDER — PROPOFOL 10 MG/ML IV BOLUS
INTRAVENOUS | Status: DC | PRN
  Administered 2018-09-10: 200 mg via INTRAVENOUS

## 2018-09-10 MED ORDER — BUPIVACAINE HCL (PF) 0.25 % IJ SOLN
INTRAMUSCULAR | Status: AC
  Filled 2018-09-10: qty 30

## 2018-09-10 MED ORDER — LACTATED RINGERS IV SOLN
INTRAVENOUS | Status: DC
  Administered 2018-09-10: 10:00:00 via INTRAVENOUS

## 2018-09-10 MED ORDER — 0.9 % SODIUM CHLORIDE (POUR BTL) OPTIME
TOPICAL | Status: DC | PRN
  Administered 2018-09-10: 1000 mL

## 2018-09-10 MED ORDER — OXYCODONE HCL 5 MG PO TABS
5.0000 mg | ORAL_TABLET | Freq: Once | ORAL | Status: AC | PRN
  Administered 2018-09-10: 5 mg via ORAL

## 2018-09-10 MED ORDER — FENTANYL CITRATE (PF) 100 MCG/2ML IJ SOLN
INTRAMUSCULAR | Status: DC | PRN
  Administered 2018-09-10: 50 ug via INTRAVENOUS
  Administered 2018-09-10: 100 ug via INTRAVENOUS

## 2018-09-10 MED ORDER — TRAMADOL HCL 50 MG PO TABS: 50.0000 mg | ORAL_TABLET | Freq: Four times a day (QID) | ORAL | 0 refills | Status: AC | PRN

## 2018-09-10 MED ORDER — LIDOCAINE 2% (20 MG/ML) 5 ML SYRINGE
INTRAMUSCULAR | Status: DC | PRN
  Administered 2018-09-10: 100 mg via INTRAVENOUS

## 2018-09-10 MED ORDER — GABAPENTIN 300 MG PO CAPS
300.0000 mg | ORAL_CAPSULE | ORAL | Status: AC
  Administered 2018-09-10: 09:00:00 300 mg via ORAL

## 2018-09-10 MED ORDER — KETOROLAC TROMETHAMINE 30 MG/ML IJ SOLN
30.0000 mg | Freq: Once | INTRAMUSCULAR | Status: AC | PRN
  Administered 2018-09-10: 30 mg via INTRAVENOUS

## 2018-09-10 MED ORDER — CHLORHEXIDINE GLUCONATE CLOTH 2 % EX PADS: 6.0000 | MEDICATED_PAD | Freq: Once | CUTANEOUS | Status: DC

## 2018-09-10 MED ORDER — SUGAMMADEX SODIUM 200 MG/2ML IV SOLN
INTRAVENOUS | Status: DC | PRN
  Administered 2018-09-10: 100 mg via INTRAVENOUS
  Administered 2018-09-10: 150 mg via INTRAVENOUS

## 2018-09-10 MED ORDER — BUPIVACAINE HCL (PF) 0.25 % IJ SOLN
INTRAMUSCULAR | Status: DC | PRN
  Administered 2018-09-10: 6 mL

## 2018-09-10 MED ORDER — OXYCODONE HCL 5 MG PO TABS
ORAL_TABLET | ORAL | Status: AC
  Filled 2018-09-10: qty 1

## 2018-09-10 MED ORDER — PROPOFOL 10 MG/ML IV BOLUS
INTRAVENOUS | Status: AC
  Filled 2018-09-10: qty 20

## 2018-09-10 MED ORDER — FENTANYL CITRATE (PF) 250 MCG/5ML IJ SOLN
INTRAMUSCULAR | Status: AC
  Filled 2018-09-10: qty 5

## 2018-09-10 MED ORDER — KETOROLAC TROMETHAMINE 30 MG/ML IJ SOLN
INTRAMUSCULAR | Status: AC
  Filled 2018-09-10: qty 1

## 2018-09-10 MED ORDER — ACETAMINOPHEN 500 MG PO TABS
1000.0000 mg | ORAL_TABLET | ORAL | Status: AC
  Administered 2018-09-10: 1000 mg via ORAL
  Filled 2018-09-10: qty 2

## 2018-09-10 SURGICAL SUPPLY — 52 items
ADH SKN CLS APL DERMABOND .7 (GAUZE/BANDAGES/DRESSINGS) ×1
APL PRP STRL LF DISP 70% ISPRP (MISCELLANEOUS) ×1
APL SKNCLS STERI-STRIP NONHPOA (GAUZE/BANDAGES/DRESSINGS) ×1
APPLIER CLIP LOGIC TI 5 (MISCELLANEOUS) IMPLANT
APR CLP MED LRG 33X5 (MISCELLANEOUS)
BENZOIN TINCTURE PRP APPL 2/3 (GAUZE/BANDAGES/DRESSINGS) ×2 IMPLANT
CANISTER SUCT 3000ML PPV (MISCELLANEOUS) IMPLANT
CHLORAPREP W/TINT 26 (MISCELLANEOUS) ×3 IMPLANT
CLOSURE STERI-STRIP 1/2X4 (GAUZE/BANDAGES/DRESSINGS) ×1
CLSR STERI-STRIP ANTIMIC 1/2X4 (GAUZE/BANDAGES/DRESSINGS) ×1 IMPLANT
COVER SURGICAL LIGHT HANDLE (MISCELLANEOUS) ×3 IMPLANT
COVER WAND RF STERILE (DRAPES) ×3 IMPLANT
DERMABOND ADVANCED (GAUZE/BANDAGES/DRESSINGS) ×2
DERMABOND ADVANCED .7 DNX12 (GAUZE/BANDAGES/DRESSINGS) ×1 IMPLANT
DISSECTOR BLUNT TIP ENDO 5MM (MISCELLANEOUS) IMPLANT
DRAPE LAPAROSCOPIC ABDOMINAL (DRAPES) ×3 IMPLANT
ELECT REM PT RETURN 9FT ADLT (ELECTROSURGICAL) ×3
ELECTRODE REM PT RTRN 9FT ADLT (ELECTROSURGICAL) ×1 IMPLANT
GLOVE BIO SURGEON STRL SZ7.5 (GLOVE) ×3 IMPLANT
GOWN STRL REUS W/ TWL LRG LVL3 (GOWN DISPOSABLE) ×2 IMPLANT
GOWN STRL REUS W/ TWL XL LVL3 (GOWN DISPOSABLE) ×1 IMPLANT
GOWN STRL REUS W/TWL LRG LVL3 (GOWN DISPOSABLE) ×6
GOWN STRL REUS W/TWL XL LVL3 (GOWN DISPOSABLE) ×3
KIT BASIN OR (CUSTOM PROCEDURE TRAY) ×3 IMPLANT
KIT TURNOVER KIT B (KITS) ×3 IMPLANT
MESH 3DMAX 5X7 LT XLRG (Mesh General) ×2 IMPLANT
MESH 3DMAX 5X7 RT XLRG (Mesh General) ×2 IMPLANT
NDL INSUFFLATION 14GA 120MM (NEEDLE) IMPLANT
NEEDLE INSUFFLATION 14GA 120MM (NEEDLE) IMPLANT
NS IRRIG 1000ML POUR BTL (IV SOLUTION) ×3 IMPLANT
PAD ARMBOARD 7.5X6 YLW CONV (MISCELLANEOUS) ×6 IMPLANT
RELOAD STAPLE 4.0 BLU F/HERNIA (INSTRUMENTS) ×1 IMPLANT
RELOAD STAPLE 4.8 BLK F/HERNIA (STAPLE) IMPLANT
RELOAD STAPLE HERNIA 4.0 BLUE (INSTRUMENTS) ×3 IMPLANT
RELOAD STAPLE HERNIA 4.8 BLK (STAPLE) IMPLANT
SCISSORS LAP 5X35 DISP (ENDOMECHANICALS) ×3 IMPLANT
SET IRRIG TUBING LAPAROSCOPIC (IRRIGATION / IRRIGATOR) IMPLANT
SET TROCAR LAP APPLE-HUNT 5MM (ENDOMECHANICALS) ×3 IMPLANT
SET TUBE SMOKE EVAC HIGH FLOW (TUBING) ×3 IMPLANT
SPONGE GAUZE 2X2 8PLY STER LF (GAUZE/BANDAGES/DRESSINGS) ×1
SPONGE GAUZE 2X2 8PLY STRL LF (GAUZE/BANDAGES/DRESSINGS) ×1 IMPLANT
STAPLER HERNIA 12 8.5 360D (INSTRUMENTS) ×3 IMPLANT
SUT MNCRL AB 4-0 PS2 18 (SUTURE) ×3 IMPLANT
SUT VIC AB 1 CT1 27 (SUTURE)
SUT VIC AB 1 CT1 27XBRD ANBCTR (SUTURE) IMPLANT
TAPE CLOTH SURG 4X10 WHT LF (GAUZE/BANDAGES/DRESSINGS) ×2 IMPLANT
TOWEL GREEN STERILE FF (TOWEL DISPOSABLE) ×3 IMPLANT
TOWEL OR 17X26 10 PK STRL BLUE (TOWEL DISPOSABLE) ×3 IMPLANT
TRAY FOLEY CATH SILVER 16FR (SET/KITS/TRAYS/PACK) ×3 IMPLANT
TRAY LAPAROSCOPIC MC (CUSTOM PROCEDURE TRAY) ×3 IMPLANT
TROCAR XCEL 12X100 BLDLESS (ENDOMECHANICALS) ×3 IMPLANT
WATER STERILE IRR 1000ML POUR (IV SOLUTION) ×3 IMPLANT

## 2018-09-10 NOTE — Transfer of Care (Signed)
Immediate Anesthesia Transfer of Care Note  Patient: Malik Miller  Procedure(s) Performed: LAPAROSCOPIC BILATERAL INGUINAL HERNIA REPAIR (Bilateral Inguinal) Insertion Of Mesh (Bilateral Inguinal)  Patient Location: PACU  Anesthesia Type:General  Level of Consciousness: awake, alert  and oriented  Airway & Oxygen Therapy: Patient Spontanous Breathing  Post-op Assessment: Report given to RN and Post -op Vital signs reviewed and stable  Post vital signs: Reviewed and stable  Last Vitals:  Vitals Value Taken Time  BP 138/84 09/10/18 1205  Temp 37 C 09/10/18 1205  Pulse 59 09/10/18 1206  Resp 15 09/10/18 1206  SpO2 95 % 09/10/18 1206  Vitals shown include unvalidated device data.  Last Pain:  Vitals:   09/10/18 1205  TempSrc:   PainSc: 3          Complications: No apparent anesthesia complications

## 2018-09-10 NOTE — H&P (Signed)
  History of Present Illness The patient is a 58 year old male who presents with an inguinal hernia. Chief Complaint: Bilateral hernias  Patient is a 59 year old male with a history of hypercholesterolemia, hypertension comes in with bilateral inguinal hernias. Patient states this began approximately symptoms 6 months ago. He states he noticed a pop to the left inguinal area after lifting something heavy. He states that he drove thereafter and noticed some stiffness. He had minimal movement thereafter. Patient states that since that time he started some discomfort and bilateral inguinal areas.  Patient recently underwent a CT scan which revealed bilateral fat-containing inguinal hernias. I did review this personally.  Patient's had previous transurethral bladder cancer removal, no other abdominal surgeries.    Allergies  No Known Allergies [07/07/2018]: No Known Drug Allergies [07/07/2018]: Allergies Reconciled   Medication History  amLODIPine Besylate (5MG  Tablet, Oral) Active. Atorvastatin Calcium (10MG  Tablet, Oral) Active. Lisinopril-hydroCHLOROthiazide (20-12.5MG  Tablet, Oral) Active. Aspirin (81MG  Tablet, Oral) Active. Cialis (20MG  Tablet, Oral) Active. Medications Reconciled    Review of Systems  All other systems negative  BP 138/88   Pulse (!) 59   Temp 98.9 F (37.2 C) (Oral)   Resp 18   Ht 5\' 11"  (1.803 m)   Wt 106.6 kg   SpO2 96%   BMI 32.78 kg/m   Physical Exam The physical exam findings are as follows: Note:Constitutional: No acute distress, conversant, appears stated age  Eyes: Anicteric sclerae, moist conjunctiva, no lid lag  Neck: No thyromegaly, trachea midline, no cervical lymphadenopathy  Lungs: Clear to auscultation biilaterally, normal respiratory effot  Cardiovascular: regular rate & rhythm, no murmurs, no peripheal edema, pedal pulses 2+  GI: Soft, no masses or hepatosplenomegaly, non-tender to  palpation  MSK: Normal gait, no clubbing cyanosis, edema  Skin: No rashes, palpation reveals normal skin turgor  Psychiatric: Appropriate judgment and insight, oriented to person, place, and time  Abdomen Inspection Hernias - Inguinal hernia - Bilateral - Reducible.    Assessment & Plan  BILATERAL INGUINAL HERNIA WITHOUT OBSTRUCTION OR GANGRENE (K40.20) Impression: 58 year old male with bilateral inguinal hernias. I discussed the surgery in detail with him and his wife over the phone.  1. The patient will like to proceed to the operating room for laparoscopic bilateral inguinal hernia repair with mesh.  2. I discussed with the patient the signs and symptoms of incarceration and strangulation and the need to proceed to the ER should they occur.  3. I discussed with the patient the risks and benefits of the procedure to include but not limited to: Infection, bleeding, damage to surrounding structures, possible need for further surgery, possible nerve pain, and possible recurrence. The patient was understanding and wishes to proceed.

## 2018-09-10 NOTE — Discharge Instructions (Signed)
[facility] _______[facility] °INGUINAL HERNIA REPAIR: POST OP INSTRUCTIONS ° °Always review your discharge instruction sheet given to you by the facility where your surgery was performed. °IF YOU HAVE DISABILITY OR FAMILY LEAVE FORMS, YOU MUST BRING THEM TO THE OFFICE FOR PROCESSING.   °DO NOT GIVE THEM TO YOUR DOCTOR. ° °1. A  prescription for pain medication may be given to you upon discharge.  Take your pain medication as prescribed, if needed.  If narcotic pain medicine is not needed, then you may take acetaminophen (Tylenol) or ibuprofen (Advil) as needed. °2. Take your usually prescribed medications unless otherwise directed. °If you need a refill on your pain medication, please contact your pharmacy.  They will contact our office to request authorization. Prescriptions will not be filled after 5 pm or on week-ends. °3. You should follow a light diet the first 24 hours after arrival home, such as soup and crackers, etc.  Be sure to include lots of fluids daily.  Resume your normal diet the day after surgery. °4.Most patients will experience some swelling and bruising around the umbilicus or in the groin and scrotum.  Ice packs and reclining will help.  Swelling and bruising can take several days to resolve.  °6. It is common to experience some constipation if taking pain medication after surgery.  Increasing fluid intake and taking a stool softener (such as Colace) will usually help or prevent this problem from occurring.  A mild laxative (Milk of Magnesia or Miralax) should be taken according to package directions if there are no bowel movements after 48 hours. °7. Unless discharge instructions indicate otherwise, you may remove your bandages 24-48 hours after surgery, and you may shower at that time.  You may have steri-strips (small skin tapes) in place directly over the incision.  These strips should be left on the skin for 7-10 days.  If your surgeon used skin glue on the incision, you may  shower in 24 hours.  The glue will flake off over the next 2-3 weeks.  Any sutures or staples will be removed at the office during your follow-up visit. °8. ACTIVITIES:  You may resume regular (light) daily activities beginning the next day--such as daily self-care, walking, climbing stairs--gradually increasing activities as tolerated.  You may have sexual intercourse when it is comfortable.  Refrain from any heavy lifting or straining until approved by your doctor. ° °a.You may drive when you are no longer taking prescription pain medication, you can comfortably wear a seatbelt, and you can safely maneuver your car and apply brakes. °b.RETURN TO WORK:   °_____________________________________________ ° °9.You should see your doctor in the office for a follow-up appointment approximately 2-3 weeks after your surgery.  Make sure that you call for this appointment within a day or two after you arrive home to insure a convenient appointment time. °10.OTHER INSTRUCTIONS: _________________________ °   _____________________________________ ° °WHEN TO CALL YOUR DOCTOR: °1. Fever over 101.0 °2. Inability to urinate °3. Nausea and/or vomiting °4. Extreme swelling or bruising °5. Continued bleeding from incision. °6. Increased pain, redness, or drainage from the incision ° °The clinic staff is available to answer your questions during regular business hours.  Please don’t hesitate to call and ask to speak to one of the nurses for clinical concerns.  If you have a medical emergency, go to the nearest emergency room or call 911.  A surgeon from [facility] is always on call at the hospital ° ° °[address], [address], [place], [place]  [address] ? ° [address] [address], [place], [place]   [address] °[phone] ? [phone] ? FAX [phone] °Web site: [email or web address] ° °

## 2018-09-10 NOTE — Anesthesia Procedure Notes (Signed)
Procedure Name: Intubation Date/Time: 09/10/2018 10:42 AM Performed by: Trinna Post., CRNA Pre-anesthesia Checklist: Patient identified, Emergency Drugs available, Suction available, Patient being monitored and Timeout performed Patient Re-evaluated:Patient Re-evaluated prior to induction Oxygen Delivery Method: Circle system utilized Preoxygenation: Pre-oxygenation with 100% oxygen Induction Type: IV induction Ventilation: Mask ventilation without difficulty Laryngoscope Size: Mac and 4 Grade View: Grade II Tube type: Oral Tube size: 7.5 mm Number of attempts: 1 Airway Equipment and Method: Stylet Placement Confirmation: ETT inserted through vocal cords under direct vision,  positive ETCO2 and breath sounds checked- equal and bilateral Secured at: 23 cm Tube secured with: Tape Dental Injury: Teeth and Oropharynx as per pre-operative assessment

## 2018-09-10 NOTE — Anesthesia Preprocedure Evaluation (Signed)
Anesthesia Evaluation  Patient identified by MRN, date of birth, ID band Patient awake    Reviewed: Allergy & Precautions, NPO status , Patient's Chart, lab work & pertinent test results  Airway Mallampati: II  TM Distance: <3 FB Neck ROM: Full    Dental no notable dental hx.    Pulmonary sleep apnea and Continuous Positive Airway Pressure Ventilation ,    Pulmonary exam normal breath sounds clear to auscultation       Cardiovascular hypertension, Normal cardiovascular exam Rhythm:Regular Rate:Normal     Neuro/Psych negative neurological ROS  negative psych ROS   GI/Hepatic negative GI ROS, (+)     substance abuse  alcohol use,   Endo/Other  negative endocrine ROS  Renal/GU negative Renal ROS  negative genitourinary   Musculoskeletal negative musculoskeletal ROS (+)   Abdominal   Peds negative pediatric ROS (+)  Hematology negative hematology ROS (+)   Anesthesia Other Findings   Reproductive/Obstetrics negative OB ROS                             Anesthesia Physical Anesthesia Plan  ASA: III  Anesthesia Plan: General   Post-op Pain Management:    Induction: Intravenous  PONV Risk Score and Plan: 2 and Ondansetron, Dexamethasone and Treatment may vary due to age or medical condition  Airway Management Planned: Oral ETT  Additional Equipment:   Intra-op Plan:   Post-operative Plan: Extubation in OR  Informed Consent: I have reviewed the patients History and Physical, chart, labs and discussed the procedure including the risks, benefits and alternatives for the proposed anesthesia with the patient or authorized representative who has indicated his/her understanding and acceptance.     Dental advisory given  Plan Discussed with: CRNA and Surgeon  Anesthesia Plan Comments:         Anesthesia Quick Evaluation

## 2018-09-10 NOTE — Anesthesia Postprocedure Evaluation (Signed)
Anesthesia Post Note  Patient: Malik Miller  Procedure(s) Performed: LAPAROSCOPIC BILATERAL INGUINAL HERNIA REPAIR (Bilateral Inguinal) Insertion Of Mesh (Bilateral Inguinal)     Patient location during evaluation: PACU Anesthesia Type: General Level of consciousness: awake and alert Pain management: pain level controlled Vital Signs Assessment: post-procedure vital signs reviewed and stable Respiratory status: spontaneous breathing, nonlabored ventilation, respiratory function stable and patient connected to nasal cannula oxygen Cardiovascular status: blood pressure returned to baseline and stable Postop Assessment: no apparent nausea or vomiting Anesthetic complications: no    Last Vitals:  Vitals:   09/10/18 1215 09/10/18 1238  BP:  127/86  Pulse: (!) 58 (!) 52  Resp: (!) 21 12  Temp:  (!) 36.1 C  SpO2: 95% 94%    Last Pain:  Vitals:   09/10/18 1238  TempSrc:   PainSc: 3                  Saleemah Mollenhauer S

## 2018-09-10 NOTE — Op Note (Signed)
09/10/2018  11:45 AM  PATIENT:  Malik Miller  58 y.o. male  PRE-OPERATIVE DIAGNOSIS:  BILATERAL INGUINAL HERNIA  POST-OPERATIVE DIAGNOSIS:  BILATERAL INDIRECT INGUINAL HERNIA, AND CORD LIPOMA  PROCEDURE:  Procedure(s): LAPAROSCOPIC BILATERAL INGUINAL HERNIA REPAIR (Bilateral) Insertion Of Mesh (Bilateral)  SURGEON:  Surgeon(s) and Role:    Malik Ok, MD - Primary  ANESTHESIA:   local and general  EBL:  minimal   BLOOD ADMINISTERED:none  DRAINS: none   LOCAL MEDICATIONS USED:  BUPIVICAINE   SPECIMEN:  No Specimen  DISPOSITION OF SPECIMEN:  N/A  COUNTS:  YES  TOURNIQUET:  * No tourniquets in log *  DICTATION: .Dragon Dictation   Counts: reported as correct x 2  Findings:  The patient had a moderate sized bilateral indirect hernias and associated cord lipomas  Indications for procedure:  The patient is a 58 year old male with a bilatearl hernia for several months. Patient complained of symptomatology to his bilateral inguinal areas. The patient was taken back for elective inguinal hernia repair.  Details of the procedure: The patient was taken back to the operating room. The patient was placed in supine position with bilateral SCDs in place.  The patient underwent GETA.  A foley catheter was placed. The patient was prepped and draped in the usual sterile fashion.  After appropriate anitbiotics were confirmed, a time-out was confirmed and all facts were verified.  0.25% Marcaine was used to infiltrate the umbilical area. A 11-blade was used to cut down the skin and blunt dissection was used to get the anterior fashion.  The anterior fascia was incised approximately 1 cm and the muscles were retracted laterally. Blunt dissection was then used to create a space in the preperitoneal area. At this time a 10 mm camera was then introduced into the space and advanced the pubic tubercle and a 12 mm trocar was placed over this and insufflation was started.  At this time  and space was created from medial to laterally the preperitoneal space.  Cooper's ligament was initially cleaned off.  The hernia sac was identified and dissected away from the cremesteric muscle fibers.  The hernia was seen in the indirect space. Dissection of the hernia sac was undertaken the vas deferens was identified and protected in all parts of the case.   A moderate cord lipoma was dissected away from the inguinal canal.  Once the hernia sac was taken down to approximately the umbilicus a Bard 3D Max mesh, size: XLarge, was  introduced into the preperitoneal space.  The mesh was brought over to cover the direct and indirect hernia spaces.  This was anchored into place and secured to Cooper's ligament with 4.17mm staples from a [place] hernia stapler. It was anchored to the anterior abdominal wall with 4.8 mm staples. The hernia sac and cord lipoma was seen lying posterior to the mesh. There was no staples placed laterally.   The exact same dissection took place on the opposite side. The spermatic cord was identified.  The hernia sac was identified and dissected away from the cremesteric muscle fibers.  The hernia was seen in the indirect space. A moderate cord lipoma was dissected away from the inguinal canal.  Dissection of the hernia sac was undertaken the vas deferens was identified and protected in all parts of the case.   Once the hernia sac was taken down to approximately the umbilicus a Bard 3D Max mesh, size: Malik Miller, was  introduced into the preperitoneal space.  The mesh was brought  over to cover the direct and indirect hernia spaces.  This was anchored into place and secured to Cooper's ligament with 4.78mm staples from a [place] hernia stapler. It was anchored to the anterior abdominal wall with 4.8 mm staples. The hernia sac and cord lipoma was seen lying posterior to the mesh. There was no staples placed laterally.    The insufflation was evacuated and the peritoneum was seen posterior  to the mesh bilaterally. The trochars were removed. The anterior fascia was reapproximated using #1 Vicryl on a UR- 6.  Intra-abdominal air was evacuated and the Veress needle removed. The skin was reapproximated using 4-0 Monocryl subcuticular fashion and was dressed with Dermabond. The  patient was awakened from general anesthesia and taken to recovery in stable condition.    PLAN OF CARE: Discharge to home after PACU  PATIENT DISPOSITION:  PACU - hemodynamically stable.   Delay start of Pharmacological VTE agent (>24hrs) due to surgical blood loss or risk of bleeding: not applicable

## 2018-09-12 ENCOUNTER — Encounter (HOSPITAL_COMMUNITY): Payer: Self-pay | Admitting: General Surgery

## 2018-11-29 DIAGNOSIS — M25561 Pain in right knee: Secondary | ICD-10-CM | POA: Diagnosis not present

## 2018-11-29 DIAGNOSIS — E782 Mixed hyperlipidemia: Secondary | ICD-10-CM | POA: Diagnosis not present

## 2018-11-29 DIAGNOSIS — N529 Male erectile dysfunction, unspecified: Secondary | ICD-10-CM | POA: Diagnosis not present

## 2018-11-29 DIAGNOSIS — I10 Essential (primary) hypertension: Secondary | ICD-10-CM | POA: Diagnosis not present

## 2019-06-13 DIAGNOSIS — I10 Essential (primary) hypertension: Secondary | ICD-10-CM | POA: Diagnosis not present

## 2019-06-13 DIAGNOSIS — Z0000 Encounter for general adult medical examination without abnormal findings: Secondary | ICD-10-CM | POA: Diagnosis not present

## 2019-06-13 DIAGNOSIS — M25561 Pain in right knee: Secondary | ICD-10-CM | POA: Diagnosis not present

## 2019-06-13 DIAGNOSIS — N529 Male erectile dysfunction, unspecified: Secondary | ICD-10-CM | POA: Diagnosis not present

## 2019-06-13 DIAGNOSIS — Z125 Encounter for screening for malignant neoplasm of prostate: Secondary | ICD-10-CM | POA: Diagnosis not present

## 2019-06-13 DIAGNOSIS — E782 Mixed hyperlipidemia: Secondary | ICD-10-CM | POA: Diagnosis not present

## 2019-07-05 DIAGNOSIS — H52223 Regular astigmatism, bilateral: Secondary | ICD-10-CM | POA: Diagnosis not present

## 2019-07-05 DIAGNOSIS — H5213 Myopia, bilateral: Secondary | ICD-10-CM | POA: Diagnosis not present

## 2019-08-03 DIAGNOSIS — Z8551 Personal history of malignant neoplasm of bladder: Secondary | ICD-10-CM | POA: Diagnosis not present

## 2019-08-03 DIAGNOSIS — N5201 Erectile dysfunction due to arterial insufficiency: Secondary | ICD-10-CM | POA: Diagnosis not present

## 2019-12-19 DIAGNOSIS — N529 Male erectile dysfunction, unspecified: Secondary | ICD-10-CM | POA: Diagnosis not present

## 2019-12-19 DIAGNOSIS — I10 Essential (primary) hypertension: Secondary | ICD-10-CM | POA: Diagnosis not present

## 2019-12-19 DIAGNOSIS — E782 Mixed hyperlipidemia: Secondary | ICD-10-CM | POA: Diagnosis not present

## 2019-12-19 DIAGNOSIS — M25561 Pain in right knee: Secondary | ICD-10-CM | POA: Diagnosis not present

## 2020-06-19 DIAGNOSIS — M25561 Pain in right knee: Secondary | ICD-10-CM | POA: Diagnosis not present

## 2020-06-19 DIAGNOSIS — I10 Essential (primary) hypertension: Secondary | ICD-10-CM | POA: Diagnosis not present

## 2020-06-19 DIAGNOSIS — E782 Mixed hyperlipidemia: Secondary | ICD-10-CM | POA: Diagnosis not present

## 2020-06-19 DIAGNOSIS — N529 Male erectile dysfunction, unspecified: Secondary | ICD-10-CM | POA: Diagnosis not present

## 2020-06-19 DIAGNOSIS — Z0000 Encounter for general adult medical examination without abnormal findings: Secondary | ICD-10-CM | POA: Diagnosis not present

## 2020-06-25 DIAGNOSIS — I10 Essential (primary) hypertension: Secondary | ICD-10-CM | POA: Diagnosis not present

## 2020-06-25 DIAGNOSIS — E782 Mixed hyperlipidemia: Secondary | ICD-10-CM | POA: Diagnosis not present

## 2020-06-25 DIAGNOSIS — Z0000 Encounter for general adult medical examination without abnormal findings: Secondary | ICD-10-CM | POA: Diagnosis not present

## 2020-10-10 IMAGING — CT CT ABDOMEN AND PELVIS WITH CONTRAST
1 of 3 series · 13 of 32 positions shown, 19 images · IV contrast (APPLIED)
Comparison: None.

CLINICAL DATA: Bilateral inguinal hernia

EXAM:
CT ABDOMEN AND PELVIS WITH CONTRAST
TECHNIQUE: Multidetector CT imaging of the abdomen and pelvis was performed
using the standard protocol following bolus administration of
intravenous contrast.
CONTRAST:  100mL EF4DGD-QDD IOPAMIDOL (EF4DGD-QDD) INJECTION 61%,
additional oral enteric contrast

[Series 2: abd/pelvis w/cm · axial · 0.87mm/px · z∈[-530,-60]mm · 13 of 110 slices shown, 19 images]
[im 8/110  soft-tissue]
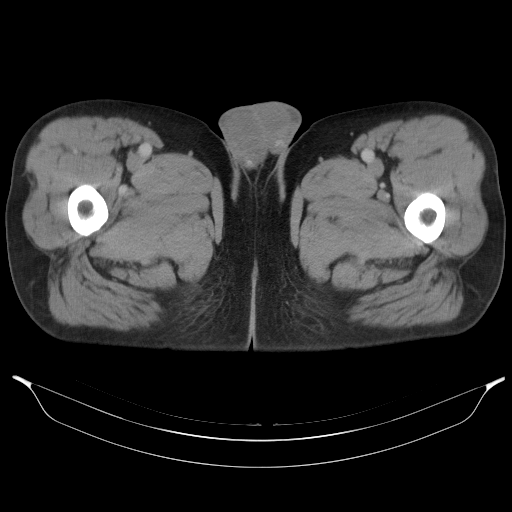
[im 8/110  bone]
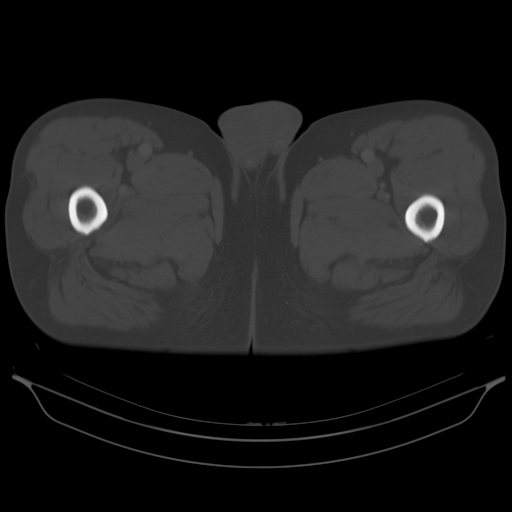
[im 16/110  soft-tissue]
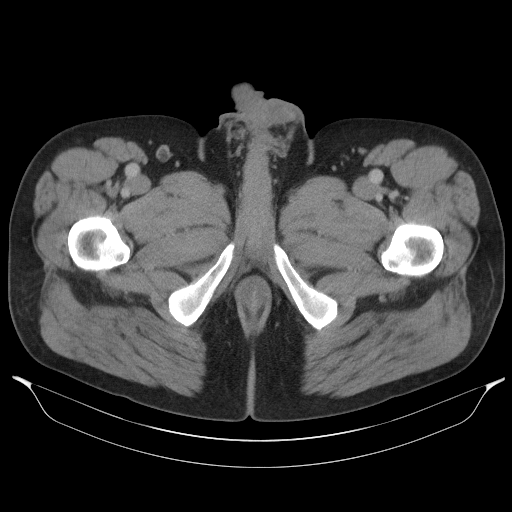
[im 24/110  soft-tissue]
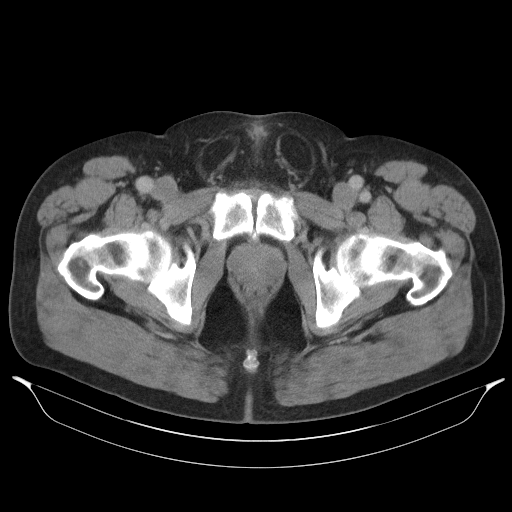
[im 32/110  soft-tissue]
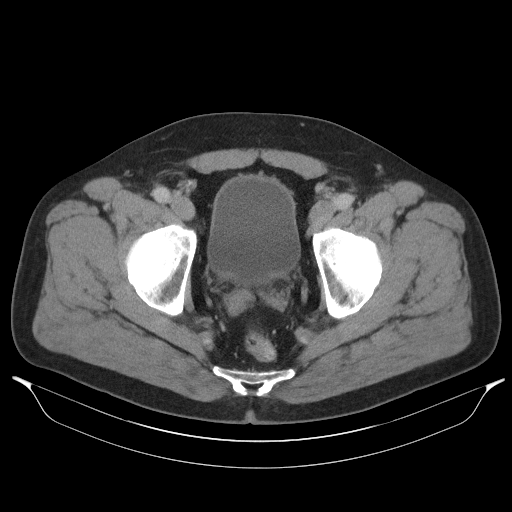
[im 39/110  soft-tissue]
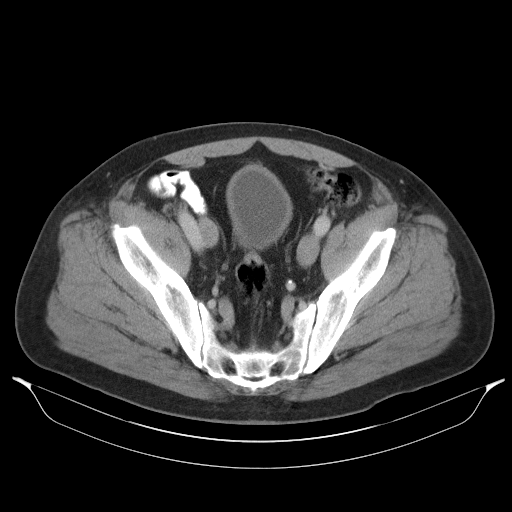
[im 47/110  soft-tissue]
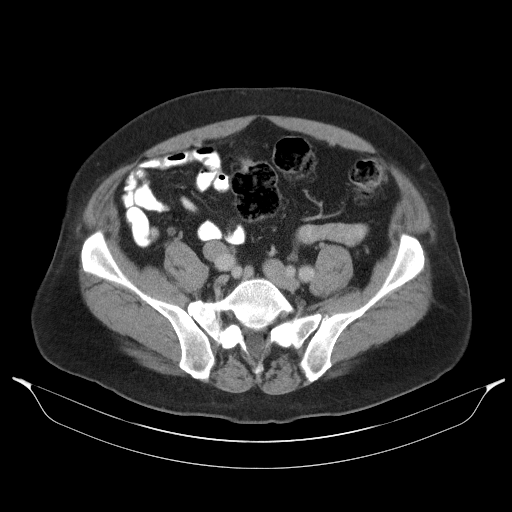
[im 55/110  soft-tissue]
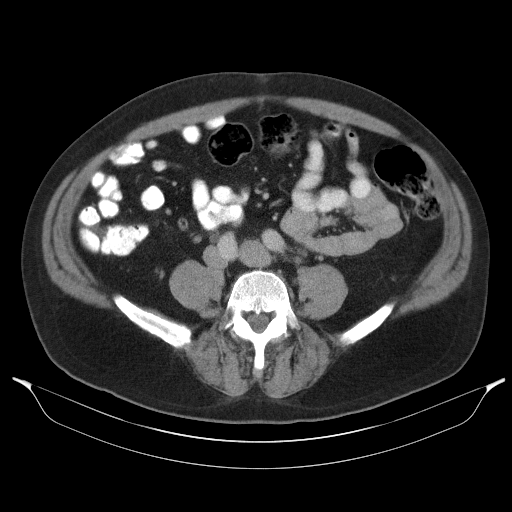
[im 63/110  soft-tissue]
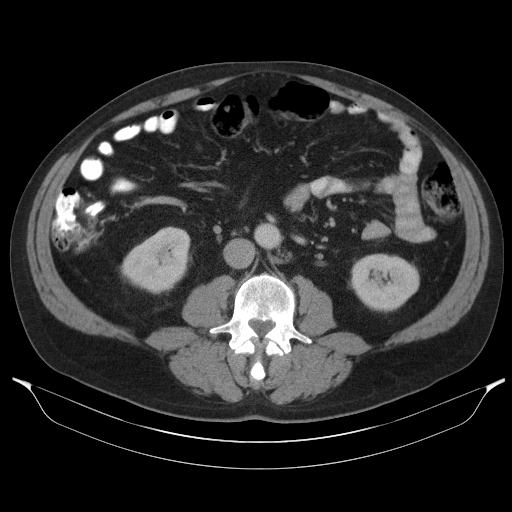
[im 71/110  soft-tissue]
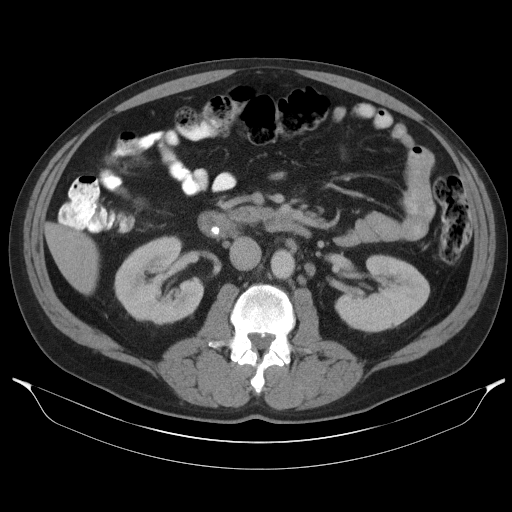
[im 71/110  bone]
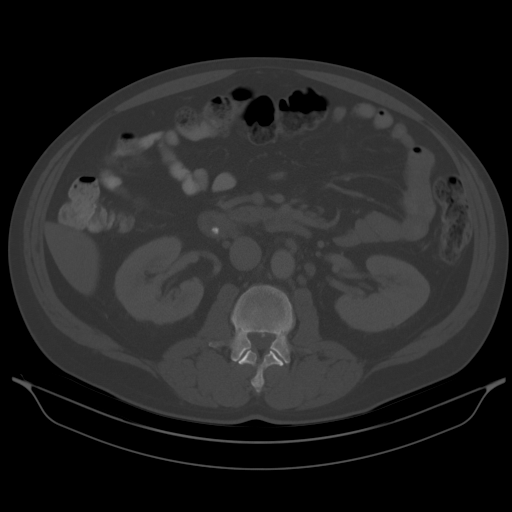
[im 78/110  soft-tissue]
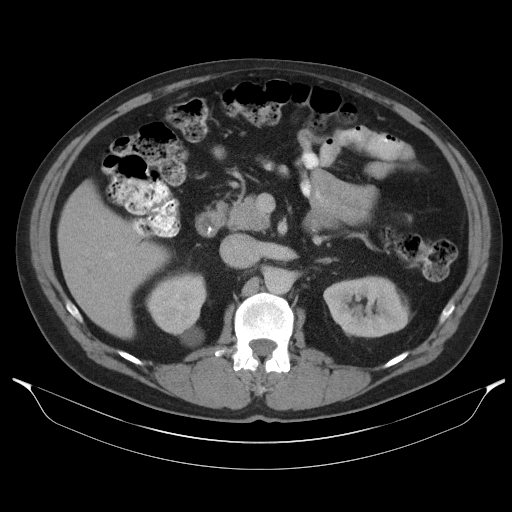
[im 78/110  lung]
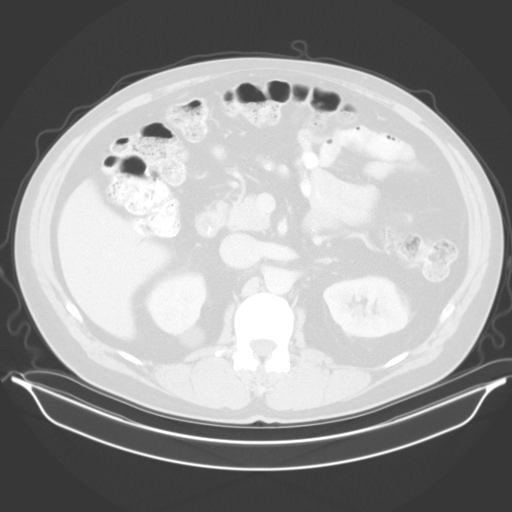
[im 86/110  soft-tissue]
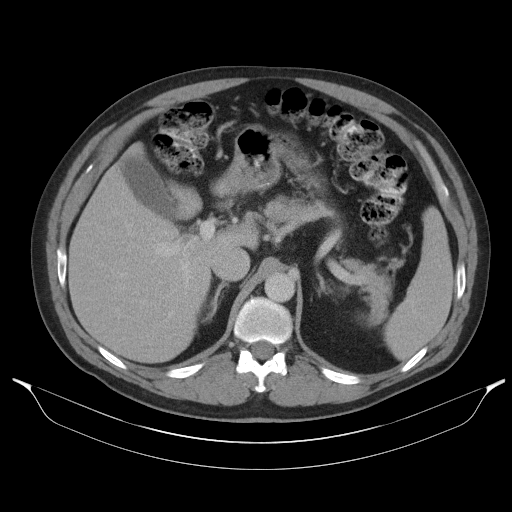
[im 86/110  lung]
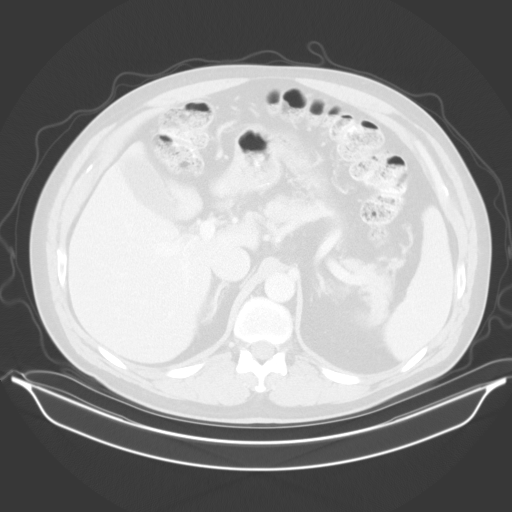
[im 94/110  soft-tissue]
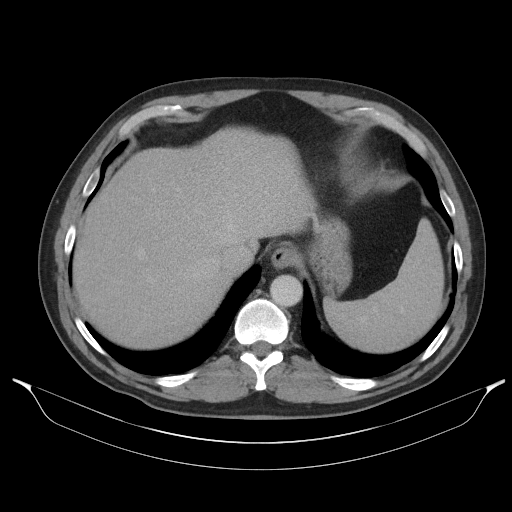
[im 94/110  lung]
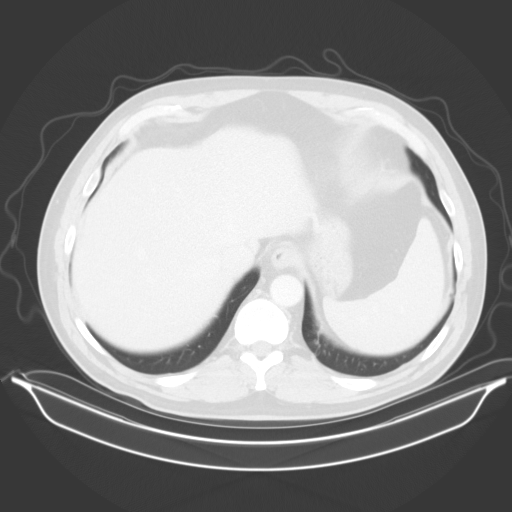
[im 102/110  soft-tissue]
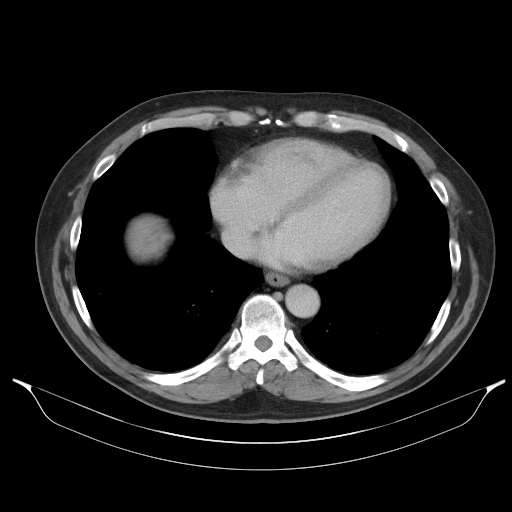
[im 102/110  lung]
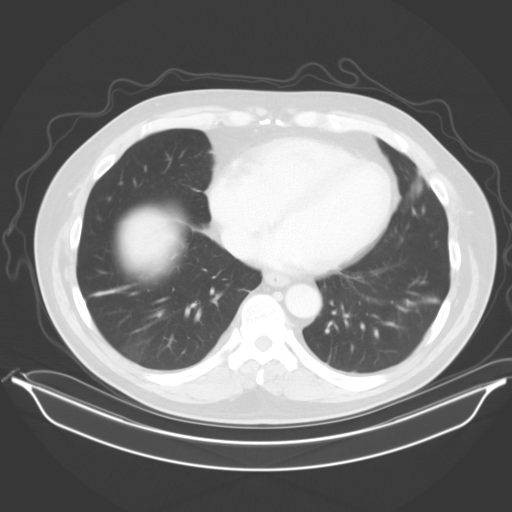

[13 of 32 positions shown; findings below may reference images not displayed]

FINDINGS: Lower chest: No acute abnormality. Bibasilar bandlike scarring or
atelectasis.

Hepatobiliary: No focal liver abnormality is seen. No gallstones,
gallbladder wall thickening, or biliary dilatation.

Pancreas: Unremarkable. No pancreatic ductal dilatation or
surrounding inflammatory changes.

Spleen: Normal in size without focal abnormality.

Adrenals/Urinary Tract: Adrenal glands are unremarkable. Kidneys are
normal, without renal calculi, focal lesion, or hydronephrosis.
Bladder is unremarkable.

Stomach/Bowel: Stomach is within normal limits. Appendix appears
normal. No evidence of bowel wall thickening, distention, or
inflammatory changes.

Vascular/Lymphatic: Scattered calcific atherosclerosis. No enlarged
abdominal or pelvic lymph nodes.

Reproductive: No mass or other abnormality.

Other: Small bilateral fat containing inguinal hernias. No
abdominopelvic ascites.

Musculoskeletal: No acute or significant osseous findings.
IMPRESSION: 1. Small bilateral fat containing inguinal hernias. No evidence of
bowel involvement.

2.  No other significant abnormality of the abdomen or pelvis.

## 2021-04-02 DIAGNOSIS — N529 Male erectile dysfunction, unspecified: Secondary | ICD-10-CM | POA: Diagnosis not present

## 2021-04-02 DIAGNOSIS — E782 Mixed hyperlipidemia: Secondary | ICD-10-CM | POA: Diagnosis not present

## 2021-04-02 DIAGNOSIS — Z8551 Personal history of malignant neoplasm of bladder: Secondary | ICD-10-CM | POA: Diagnosis not present

## 2021-04-02 DIAGNOSIS — I10 Essential (primary) hypertension: Secondary | ICD-10-CM | POA: Diagnosis not present

## 2021-04-02 DIAGNOSIS — Z125 Encounter for screening for malignant neoplasm of prostate: Secondary | ICD-10-CM | POA: Diagnosis not present

## 2021-04-02 DIAGNOSIS — E669 Obesity, unspecified: Secondary | ICD-10-CM | POA: Diagnosis not present

## 2021-04-16 DIAGNOSIS — H524 Presbyopia: Secondary | ICD-10-CM | POA: Diagnosis not present

## 2021-04-16 DIAGNOSIS — H1045 Other chronic allergic conjunctivitis: Secondary | ICD-10-CM | POA: Diagnosis not present

## 2021-04-16 DIAGNOSIS — H2513 Age-related nuclear cataract, bilateral: Secondary | ICD-10-CM | POA: Diagnosis not present

## 2021-05-16 DIAGNOSIS — X58XXXA Exposure to other specified factors, initial encounter: Secondary | ICD-10-CM | POA: Diagnosis not present

## 2021-05-16 DIAGNOSIS — I10 Essential (primary) hypertension: Secondary | ICD-10-CM | POA: Diagnosis not present

## 2021-05-16 DIAGNOSIS — M79661 Pain in right lower leg: Secondary | ICD-10-CM | POA: Diagnosis not present

## 2021-07-18 DIAGNOSIS — R0989 Other specified symptoms and signs involving the circulatory and respiratory systems: Secondary | ICD-10-CM | POA: Diagnosis not present

## 2021-07-31 DIAGNOSIS — K635 Polyp of colon: Secondary | ICD-10-CM | POA: Diagnosis not present

## 2021-07-31 DIAGNOSIS — Z1211 Encounter for screening for malignant neoplasm of colon: Secondary | ICD-10-CM | POA: Diagnosis not present

## 2021-07-31 DIAGNOSIS — Z86010 Personal history of colonic polyps: Secondary | ICD-10-CM | POA: Diagnosis not present

## 2021-07-31 DIAGNOSIS — D123 Benign neoplasm of transverse colon: Secondary | ICD-10-CM | POA: Diagnosis not present

## 2021-08-14 DIAGNOSIS — E785 Hyperlipidemia, unspecified: Secondary | ICD-10-CM | POA: Diagnosis not present

## 2021-08-14 DIAGNOSIS — I10 Essential (primary) hypertension: Secondary | ICD-10-CM | POA: Diagnosis not present

## 2021-08-14 DIAGNOSIS — Z6839 Body mass index (BMI) 39.0-39.9, adult: Secondary | ICD-10-CM | POA: Diagnosis not present

## 2021-08-14 DIAGNOSIS — Z125 Encounter for screening for malignant neoplasm of prostate: Secondary | ICD-10-CM | POA: Diagnosis not present

## 2021-08-14 DIAGNOSIS — Z0000 Encounter for general adult medical examination without abnormal findings: Secondary | ICD-10-CM | POA: Diagnosis not present

## 2021-08-14 DIAGNOSIS — E669 Obesity, unspecified: Secondary | ICD-10-CM | POA: Diagnosis not present

## 2024-02-18 ENCOUNTER — Encounter (HOSPITAL_COMMUNITY): Payer: Self-pay | Admitting: General Surgery
